# Patient Record
Sex: Male | Born: 1995 | Hispanic: Yes | Marital: Single | Smoking: Current some day smoker
Health system: Southern US, Community
[De-identification: ages and names within clinical notes are randomized; demographics above are authoritative.]

---

## 2015-09-26 ENCOUNTER — Emergency Department (HOSPITAL_COMMUNITY): Payer: Self-pay

## 2015-09-26 ENCOUNTER — Encounter (HOSPITAL_COMMUNITY): Payer: Self-pay | Admitting: Emergency Medicine

## 2015-09-26 ENCOUNTER — Emergency Department (HOSPITAL_COMMUNITY)
Admission: EM | Admit: 2015-09-26 | Discharge: 2015-09-26 | Disposition: A | Discharge status: Home / Self Care | Payer: Self-pay | Attending: Emergency Medicine | Admitting: Emergency Medicine

## 2015-09-26 DIAGNOSIS — S2020XA Contusion of thorax, unspecified, initial encounter: Secondary | ICD-10-CM | POA: Insufficient documentation

## 2015-09-26 DIAGNOSIS — S060X1A Concussion with loss of consciousness of 30 minutes or less, initial encounter: Secondary | ICD-10-CM | POA: Insufficient documentation

## 2015-09-26 DIAGNOSIS — Y92410 Unspecified street and highway as the place of occurrence of the external cause: Secondary | ICD-10-CM | POA: Insufficient documentation

## 2015-09-26 DIAGNOSIS — S20212A Contusion of left front wall of thorax, initial encounter: Secondary | ICD-10-CM

## 2015-09-26 DIAGNOSIS — Y939 Activity, unspecified: Secondary | ICD-10-CM | POA: Insufficient documentation

## 2015-09-26 DIAGNOSIS — S0181XA Laceration without foreign body of other part of head, initial encounter: Secondary | ICD-10-CM | POA: Insufficient documentation

## 2015-09-26 DIAGNOSIS — S161XXA Strain of muscle, fascia and tendon at neck level, initial encounter: Secondary | ICD-10-CM | POA: Insufficient documentation

## 2015-09-26 DIAGNOSIS — Y999 Unspecified external cause status: Secondary | ICD-10-CM | POA: Insufficient documentation

## 2015-09-26 DIAGNOSIS — S0083XA Contusion of other part of head, initial encounter: Secondary | ICD-10-CM

## 2015-09-26 DIAGNOSIS — F17210 Nicotine dependence, cigarettes, uncomplicated: Secondary | ICD-10-CM | POA: Insufficient documentation

## 2015-09-26 LAB — COMPREHENSIVE METABOLIC PANEL
ALBUMIN: 4.2 g/dL (ref 3.5–5.0)
ALT: 26 U/L (ref 17–63)
AST: 27 U/L (ref 15–41)
Alkaline Phosphatase: 66 U/L (ref 38–126)
Anion gap: 8 (ref 5–15)
BUN: 12 mg/dL (ref 6–20)
CHLORIDE: 105 mmol/L (ref 101–111)
CO2: 25 mmol/L (ref 22–32)
CREATININE: 0.92 mg/dL (ref 0.61–1.24)
Calcium: 9.5 mg/dL (ref 8.9–10.3)
GFR calc Af Amer: >60 mL/min (ref >60)
GLUCOSE: 98 mg/dL (ref 65–99)
POTASSIUM: 3.6 mmol/L (ref 3.5–5.1)
SODIUM: 138 mmol/L (ref 135–145)
Total Bilirubin: 0.6 mg/dL (ref 0.3–1.2)
Total Protein: 6.7 g/dL (ref 6.5–8.1)

## 2015-09-26 LAB — CBC WITH DIFFERENTIAL/PLATELET
Basophils Absolute: 0 10*3/uL (ref 0.0–0.1)
Basophils Relative: 0 %
Eosinophils Absolute: 0.2 10*3/uL (ref 0.0–0.7)
Eosinophils Relative: 2 %
HEMATOCRIT: 49.6 % (ref 39.0–52.0)
HEMOGLOBIN: 16.7 g/dL (ref 13.0–17.0)
LYMPHS ABS: 3.4 10*3/uL (ref 0.7–4.0)
LYMPHS PCT: 38 %
MCH: 28.5 pg (ref 26.0–34.0)
MCHC: 33.7 g/dL (ref 30.0–36.0)
MCV: 84.6 fL (ref 78.0–100.0)
MONO ABS: 0.8 10*3/uL (ref 0.1–1.0)
MONOS PCT: 8 %
NEUTROS ABS: 4.7 10*3/uL (ref 1.7–7.7)
NEUTROS PCT: 52 %
Platelets: 291 10*3/uL (ref 150–400)
RBC: 5.86 MIL/uL — ABNORMAL HIGH (ref 4.22–5.81)
RDW: 13.3 % (ref 11.5–15.5)
WBC: 9.1 10*3/uL (ref 4.0–10.5)

## 2015-09-26 LAB — URINALYSIS, ROUTINE W REFLEX MICROSCOPIC
BILIRUBIN URINE: NEGATIVE
GLUCOSE, UA: NEGATIVE mg/dL
Hgb urine dipstick: NEGATIVE
KETONES UR: NEGATIVE mg/dL
Leukocytes, UA: NEGATIVE
NITRITE: NEGATIVE
PH: 7.5 (ref 5.0–8.0)
Protein, ur: NEGATIVE mg/dL
SPECIFIC GRAVITY, URINE: 1.013 (ref 1.005–1.030)

## 2015-09-26 LAB — ETHANOL: Alcohol, Ethyl (B): <5 mg/dL (ref <5)

## 2015-09-26 LAB — LIPASE, BLOOD: Lipase: 21 U/L (ref 11–51)

## 2015-09-26 MED ORDER — HYDROCODONE-ACETAMINOPHEN 5-325 MG PO TABS: 1.0000 | ORAL_TABLET | ORAL | 0 refills | Status: DC | PRN

## 2015-09-26 MED ORDER — TETRACAINE HCL 0.5 % OP SOLN
2.0000 [drp] | Freq: Once | OPHTHALMIC | Status: AC
  Administered 2015-09-26: 2 [drp] via OPHTHALMIC
  Filled 2015-09-26: qty 2

## 2015-09-26 MED ORDER — ONDANSETRON HCL 4 MG/2ML IJ SOLN
4.0000 mg | Freq: Once | INTRAMUSCULAR | Status: AC
  Administered 2015-09-26: 4 mg via INTRAVENOUS
  Filled 2015-09-26: qty 2

## 2015-09-26 MED ORDER — MORPHINE SULFATE (PF) 4 MG/ML IV SOLN
4.0000 mg | Freq: Once | INTRAVENOUS | Status: AC
  Administered 2015-09-26: 4 mg via INTRAVENOUS
  Filled 2015-09-26: qty 1

## 2015-09-26 MED ORDER — IBUPROFEN 800 MG PO TABS: 800.0000 mg | ORAL_TABLET | Freq: Three times a day (TID) | ORAL | 0 refills | Status: DC

## 2015-09-26 MED ORDER — FLUORESCEIN SODIUM 1 MG OP STRP
1.0000 | ORAL_STRIP | Freq: Once | OPHTHALMIC | Status: AC
  Administered 2015-09-26: 1 via OPHTHALMIC
  Filled 2015-09-26: qty 1

## 2015-09-26 MED ORDER — SODIUM CHLORIDE 0.9 % IV BOLUS (SEPSIS)
1000.0000 mL | Freq: Once | INTRAVENOUS | Status: AC
  Administered 2015-09-26: 1000 mL via INTRAVENOUS

## 2015-09-26 NOTE — ED Notes (Signed)
Right eye flushed with saline. Pt sts the eye feels better.

## 2015-09-26 NOTE — ED Notes (Signed)
Pt requesting pain medication.  
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
## 2015-09-28 ENCOUNTER — Encounter (HOSPITAL_BASED_OUTPATIENT_CLINIC_OR_DEPARTMENT_OTHER): Payer: Self-pay | Admitting: Emergency Medicine
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
## 2023-04-23 DEATH — deceased
